# Patient Record
Sex: Female | Born: 1960 | Race: White | Hispanic: No | Marital: Married | Smoking: Light tobacco smoker
Health system: Southern US, Community
[De-identification: ages and names within clinical notes are randomized; demographics above are authoritative.]

## PROBLEM LIST (undated history)

## (undated) HISTORY — PX: ABDOMINAL HYSTERECTOMY: SHX81

---

## 2019-07-07 ENCOUNTER — Emergency Department (HOSPITAL_BASED_OUTPATIENT_CLINIC_OR_DEPARTMENT_OTHER): Payer: Worker's Compensation

## 2019-07-07 ENCOUNTER — Other Ambulatory Visit: Payer: Self-pay

## 2019-07-07 ENCOUNTER — Encounter (HOSPITAL_BASED_OUTPATIENT_CLINIC_OR_DEPARTMENT_OTHER): Payer: Self-pay | Admitting: Emergency Medicine

## 2019-07-07 ENCOUNTER — Emergency Department (HOSPITAL_BASED_OUTPATIENT_CLINIC_OR_DEPARTMENT_OTHER)
Admission: EM | Admit: 2019-07-07 | Discharge: 2019-07-07 | Disposition: A | Discharge status: Home / Self Care | Payer: Worker's Compensation | Attending: Emergency Medicine | Admitting: Emergency Medicine

## 2019-07-07 DIAGNOSIS — F17200 Nicotine dependence, unspecified, uncomplicated: Secondary | ICD-10-CM | POA: Insufficient documentation

## 2019-07-07 DIAGNOSIS — Y939 Activity, unspecified: Secondary | ICD-10-CM | POA: Insufficient documentation

## 2019-07-07 DIAGNOSIS — S0990XA Unspecified injury of head, initial encounter: Secondary | ICD-10-CM

## 2019-07-07 DIAGNOSIS — Y929 Unspecified place or not applicable: Secondary | ICD-10-CM | POA: Insufficient documentation

## 2019-07-07 DIAGNOSIS — W000XXA Fall on same level due to ice and snow, initial encounter: Secondary | ICD-10-CM | POA: Diagnosis not present

## 2019-07-07 DIAGNOSIS — W19XXXA Unspecified fall, initial encounter: Secondary | ICD-10-CM

## 2019-07-07 DIAGNOSIS — Y999 Unspecified external cause status: Secondary | ICD-10-CM | POA: Diagnosis not present

## 2019-07-07 DIAGNOSIS — Z79899 Other long term (current) drug therapy: Secondary | ICD-10-CM | POA: Insufficient documentation

## 2019-07-07 DIAGNOSIS — S0090XA Unspecified superficial injury of unspecified part of head, initial encounter: Secondary | ICD-10-CM

## 2019-07-07 DIAGNOSIS — S0003XA Contusion of scalp, initial encounter: Secondary | ICD-10-CM | POA: Diagnosis not present

## 2019-07-07 MED ORDER — ACETAMINOPHEN 500 MG PO TABS
1000.0000 mg | ORAL_TABLET | Freq: Once | ORAL | Status: AC
  Administered 2019-07-07: 1000 mg via ORAL
  Filled 2019-07-07: qty 2

## 2019-07-07 NOTE — ED Provider Notes (Signed)
[facility]ERG[facility] DEPARTMENT Provider Note  CSN: [id number] Arrival date & time: 07/07/19 0002  Chief Complaint(s) Fall  HPI Dorothy Branch is a 58 y.o. female presents to the emergency department after slipping on black ice 2 hours prior to arrival resulting in occipital head trauma.  Patient denies any loss of consciousness.  She does endorse headache and scalp pain.  No visual disturbance, neck pain, back pain, extremity pain, chest pain, shortness of breath.  Patient able to ambulate without complication.  No focal deficits.  Denies any other physical complaints.  Patient not on any anticoagulation.  HPI  Past Medical History History reviewed. No pertinent past medical history. There are no problems to display for this patient.  Home Medication(s) Prior to Admission medications   Medication Sig Start Date End Date Taking? Authorizing Provider  atenolol (TENORMIN) 25 MG tablet Take by mouth. 11/19/18  Yes [provider]  ergocalciferol (VITAMIN D2) 1.25 MG (50000 UT) capsule TAKE ONE CAPSULE BY MOUTH ONE TIME PER WEEK 05/27/19  Yes [provider]  FLUoxetine (PROZAC) 20 MG capsule TAKE 1 BY MOUTH DAILY 01/25/19  Yes [provider]  omeprazole (PRILOSEC) 20 MG capsule Take by mouth. 03/04/19 03/03/20 Yes [provider]  primidone (MYSOLINE) 50 MG tablet TAKE 1/2-1 TABLET MIDDAY AS NEEDED AND 1 TAB AT BEDTIME 06/29/19  Yes [provider]  tiZANidine (ZANAFLEX) 2 MG tablet TAKE 1-2 TABS UP TO TWICE DAILY AS NEEDED. 06/07/19  Yes [provider]  cetirizine (ZYRTEC) 10 MG tablet Take by mouth.    [provider]  ibuprofen (ADVIL) 800 MG tablet Take by mouth.    [provider]  Multiple Vitamin (MULTI-VITAMIN) tablet Take by mouth.    [provider]                                                                                                                                    Past  Surgical History Past Surgical History:  Procedure Laterality Date  . ABDOMINAL HYSTERECTOMY     Family History History reviewed. No pertinent family history.  Social History Social History   Tobacco Use  . Smoking status: Light Tobacco Smoker  . Smokeless tobacco: Never Used  Substance Use Topics  . Alcohol use: Not on file  . Drug use: Not on file   Allergies Prednisone and Sulfamethoxazole  Review of Systems Review of Systems All other systems are reviewed and are negative for acute change except as noted in the HPI  Physical Exam Vital Signs  I have reviewed the triage vital signs BP (!) 134/103 (BP Location: Left Arm)   Pulse 85   Temp 98.3 F (36.8 C) (Oral)   Resp 18   Ht 5\' 3"  (1.6 m)   Wt 53.1 kg   SpO2 95%   BMI 20.73 kg/m   Physical Exam Constitutional:      General: She is not  in acute distress.    Appearance: She is well-developed. She is not diaphoretic.  HENT:     Head: Normocephalic. Contusion present.      Right Ear: External ear normal.     Left Ear: External ear normal.     Nose: Nose normal.  Eyes:     General: No scleral icterus.       Right eye: No discharge.        Left eye: No discharge.     Conjunctiva/sclera: Conjunctivae normal.     Pupils: Pupils are equal, round, and reactive to light.  Cardiovascular:     Rate and Rhythm: Normal rate and regular rhythm.     Pulses:          Radial pulses are 2+ on the right side and 2+ on the left side.       Dorsalis pedis pulses are 2+ on the right side and 2+ on the left side.     Heart sounds: Normal heart sounds. No murmur. No friction rub. No gallop.   Pulmonary:     Effort: Pulmonary effort is normal. No respiratory distress.     Breath sounds: Normal breath sounds. No stridor. No wheezing.  Abdominal:     General: There is no distension.     Palpations: Abdomen is soft.     Tenderness: There is no abdominal tenderness.  Musculoskeletal:        General: No tenderness.      Cervical back: Normal range of motion and neck supple. No bony tenderness.     Thoracic back: No bony tenderness.     Lumbar back: No bony tenderness.     Comments: Clavicles stable. Chest stable to AP/Lat compression. Pelvis stable to Lat compression. No obvious extremity deformity. No chest or abdominal wall contusion.  Skin:    General: Skin is warm and dry.     Findings: No erythema or rash.  Neurological:     Mental Status: She is alert and oriented to person, place, and time.     Comments: Moving all extremities     ED Results and Treatments Labs (all labs ordered are listed, but only abnormal results are displayed) Labs Reviewed - No data to display                                                                                                                       EKG  EKG Interpretation  Date/Time:    Ventricular Rate:    PR Interval:    QRS Duration:   QT Interval:    QTC Calculation:   R Axis:     Text Interpretation:        Radiology CT Head Wo Contrast  Result Date: 07/07/2019 CLINICAL DATA:  Head trauma, intracranial venous injury suspected Patient reports she slipped on ice in the parking lot at her work striking head on concrete. Swelling. EXAM: CT HEAD WITHOUT CONTRAST TECHNIQUE: Contiguous axial images were obtained from the base of  the skull through the vertex without intravenous contrast. COMPARISON:  Report from head CT 05/17/2014, images not available. Report from brain MRI 12/14/2011. FINDINGS: Brain: No evidence of acute infarction, hemorrhage, hydrocephalus, extra-axial collection or mass lesion/mass effect. Encephalomalacia in the right parietal and to a lesser extent occipital lobes suggesting prior ischemia, however not described on 2015 head CT. Vascular: Atherosclerosis of skullbase vasculature without hyperdense vessel or abnormal calcification. Skull: No fracture or focal lesion. Sinuses/Orbits: Small fluid level in the left maxillary sinus with  scattered mucosal thickening of the ethmoid air cells. Mastoid air cells are clear. No evidence of acute facial fracture. Other: Right parietal scalp hematoma. IMPRESSION: 1. Right parietal scalp hematoma. No skull fracture or acute intracranial abnormality. 2. Mild encephalomalacia in the right parietal and to a lesser extent occipital lobes suggesting prior ischemia, however not described on 2015 head CT. Recommend correlation with clinical history. Electronically Signed   By: Narda Rutherford M.D.   On: 07/07/2019 01:43    Pertinent labs & imaging results that were available during my care of the patient were reviewed by me and considered in my medical decision making (see chart for details).  Medications Ordered in ED Medications  acetaminophen (TYLENOL) tablet 1,000 mg (1,000 mg Oral Given 07/07/19 0143)                                                                                                                                    Procedures Procedures  (including critical care time)  Medical Decision Making / ED Course I have reviewed the nursing notes for this encounter and the patient's prior records (if available in EHR or on provided paperwork).   Gretel Cantu was evaluated in Emergency Department on 07/07/2019 for the symptoms described in the history of present illness. She was evaluated in the context of the global COVID-19 pandemic, which necessitated consideration that the patient might be at risk for infection with the SARS-CoV-2 virus that causes COVID-19. Institutional protocols and algorithms that pertain to the evaluation of patients at risk for COVID-19 are in a state of rapid change based on information released by regulatory bodies including the CDC and federal and state organizations. These policies and algorithms were followed during the patient's care in the ED.  CT head without evidence of ICH.  No other evidence of trauma noted on exam requiring imaging or work-up.   Patient provided with Tylenol for pain.  The patient appears reasonably screened and/or stabilized for discharge and I doubt any other medical condition or other [facility] requiring further screening, evaluation, or treatment in the ED at this time prior to discharge.  The patient is safe for discharge with strict return precautions.       Final Clinical Impression(s) / ED Diagnoses Final diagnoses:  Fall, initial encounter  Minor head trauma  Hematoma of scalp, initial encounter     The patient appears reasonably screened and/or stabilized for  discharge and I doubt any other medical condition or other [facility] requiring further screening, evaluation, or treatment in the ED at this time prior to discharge.  Disposition: Discharge  Condition: Good  I have discussed the results, Dx and Tx plan with the patient who expressed understanding and agree(s) with the plan. Discharge instructions discussed at great length. The patient was given strict return precautions who verbalized understanding of the instructions. No further questions at time of discharge.    ED Discharge Orders    None       Follow Up: Primary care provider  Schedule an appointment as soon as possible for a visit  As needed     This chart was dictated using voice recognition software.  Despite best efforts to proofread,  errors can occur which can change the documentation meaning.   Nira Conn, MD 07/07/19 [phone]

## 2019-07-07 NOTE — ED Triage Notes (Signed)
Patient states she slipped on ice in the parking lot at her work and hit her head on the concrete; swelling noted to back of head; denies LOC; denies any other injuries. Ambulatory to triage without difficulty.

## 2020-03-17 IMAGING — CT CT HEAD W/O CM
3 series · 14 of 47 positions shown, 16 images · non-contrast
Comparison: Report from head CT 05/17/2014, images not available.
Report from brain MRI 12/14/2011.

CLINICAL DATA: Head trauma, intracranial venous injury suspected

Patient reports she slipped on ice in the parking lot at her work
striking head on concrete. Swelling.
EXAM:
CT HEAD WITHOUT CONTRAST
TECHNIQUE: Contiguous axial images were obtained from the base of the skull
through the vertex without intravenous contrast.

[Series 2: head (person_name) (person_name) · axial · 0.43mm/px · z∈[+828,+968]mm · 8 of 34 slices shown, 10 images]
[im 3/34  brain]
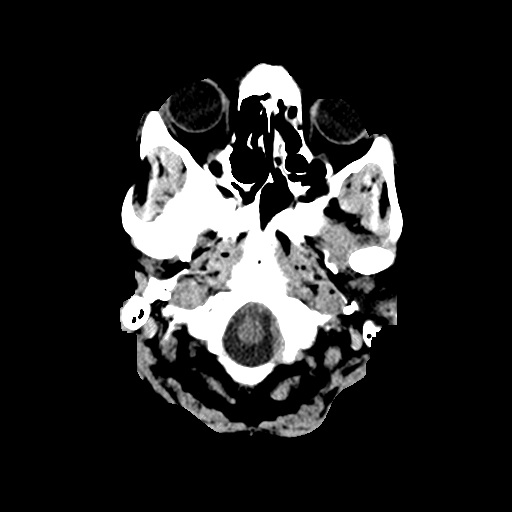
[im 3/34  bone]
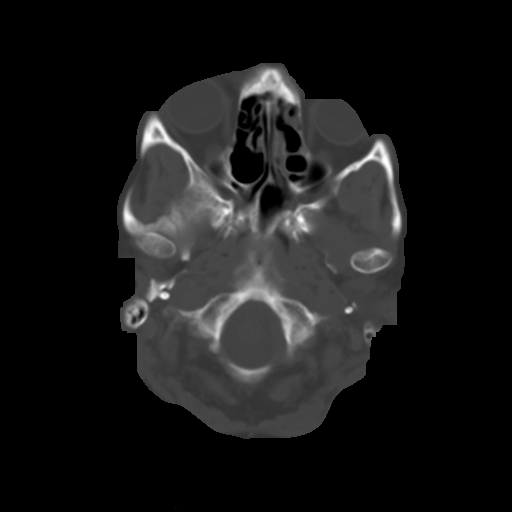
[im 7/34  brain]
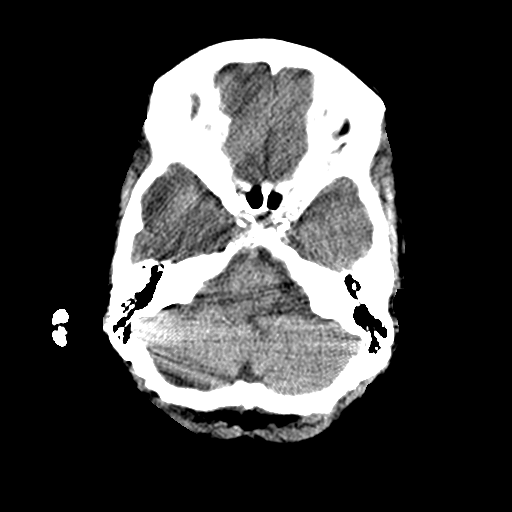
[im 11/34  brain]
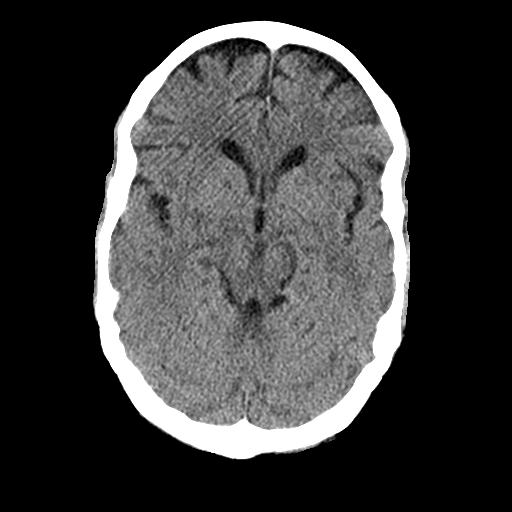
[im 15/34  brain]
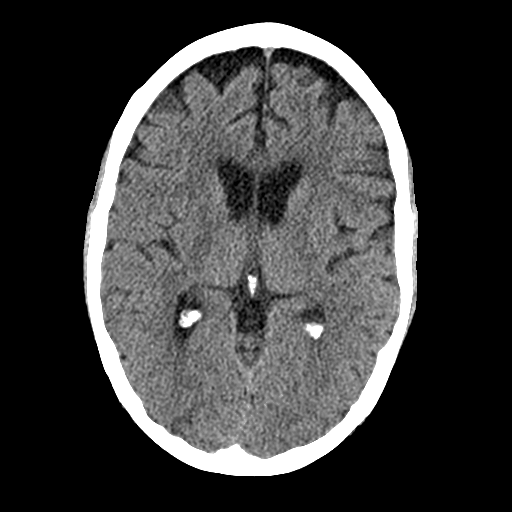
[im 19/34  brain]
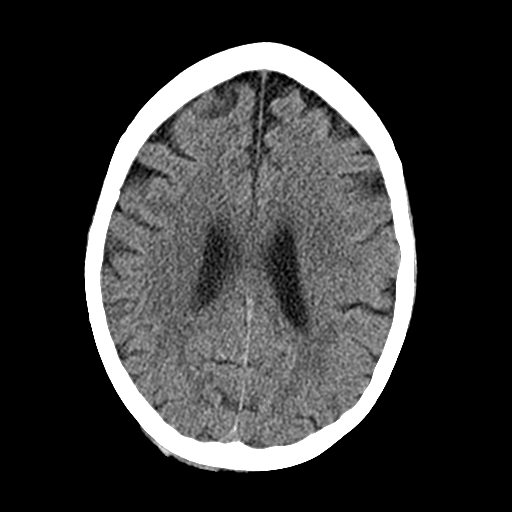
[im 19/34  bone]
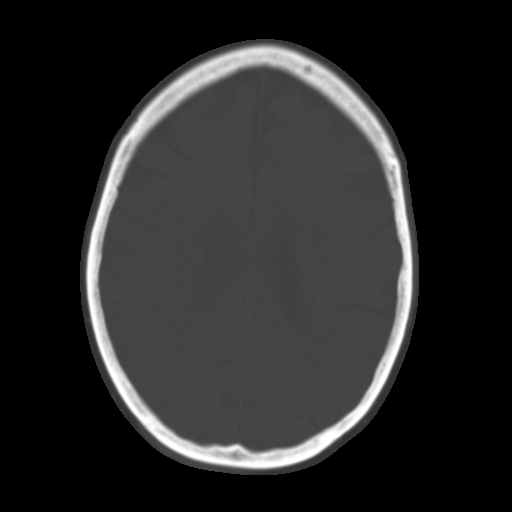
[im 23/34  brain]
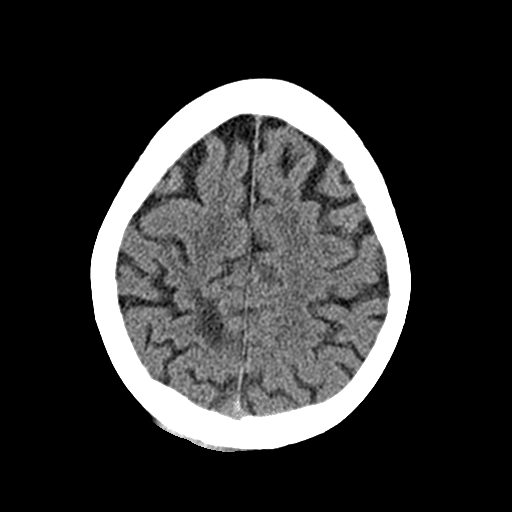
[im 27/34  brain]
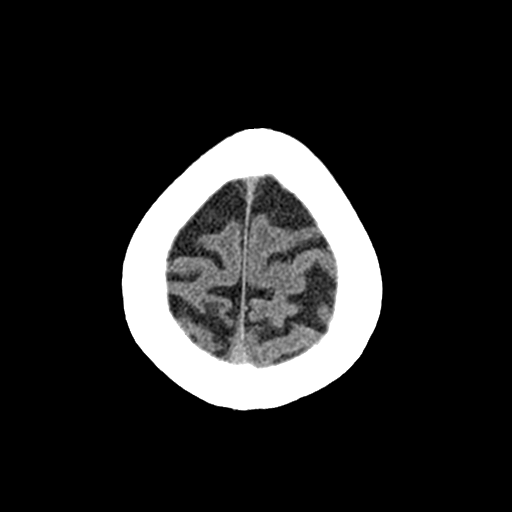
[im 31/34  brain]
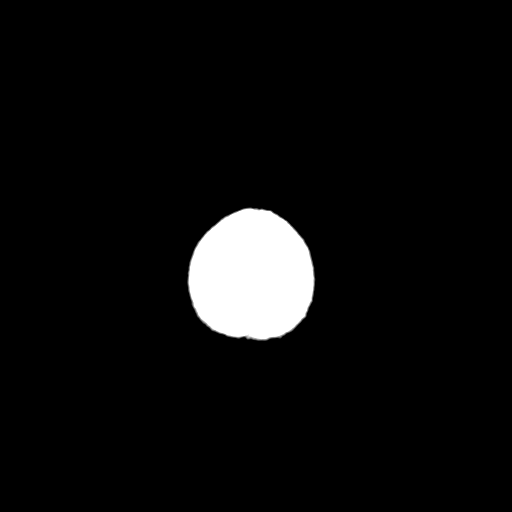

[Series 4: cor soft · coronal · 0.32mm/px · 3 of 76 slices shown]
[im 26/76  brain]
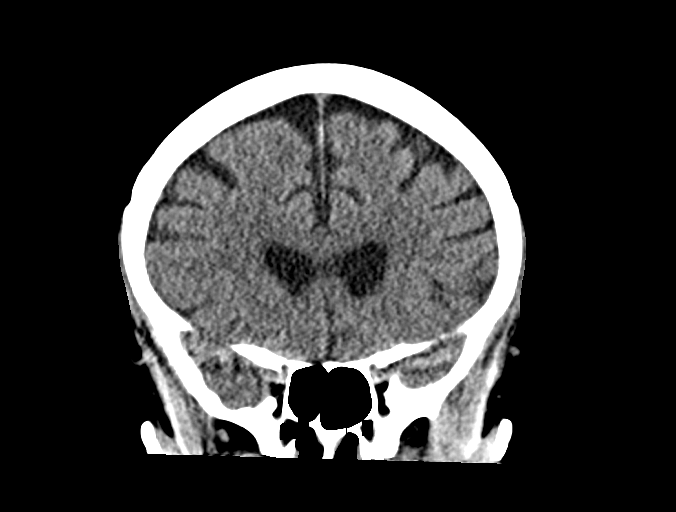
[im 34/76  brain]
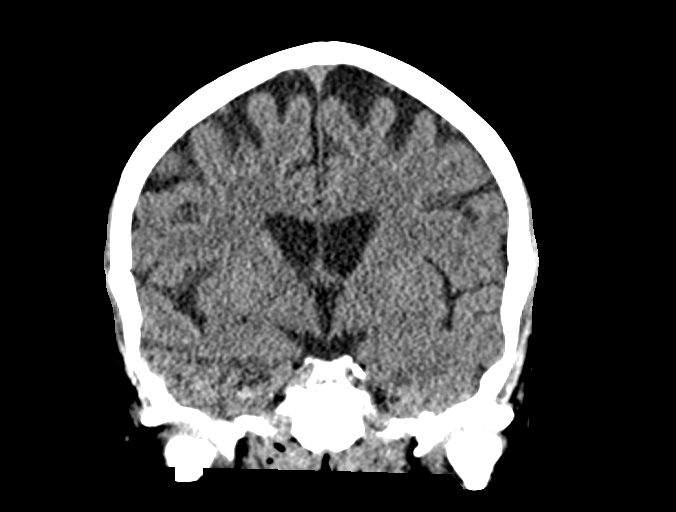
[im 42/76  brain]
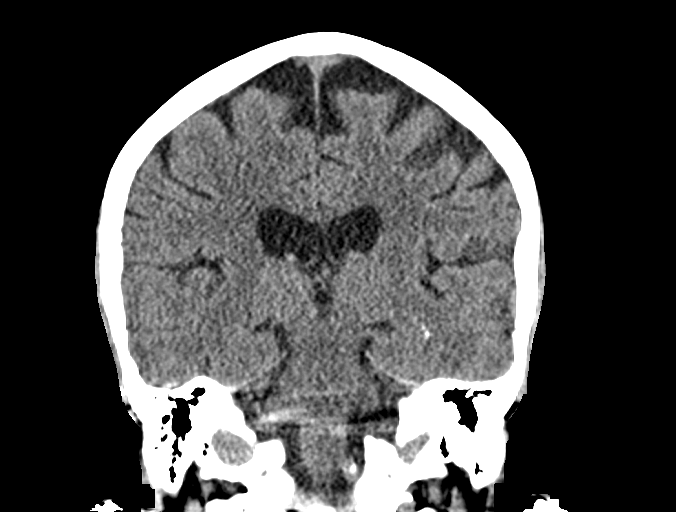

[Series 5: sag soft · sagittal · 0.37mm/px · 3 of 57 slices shown]
[im 19/57  brain]
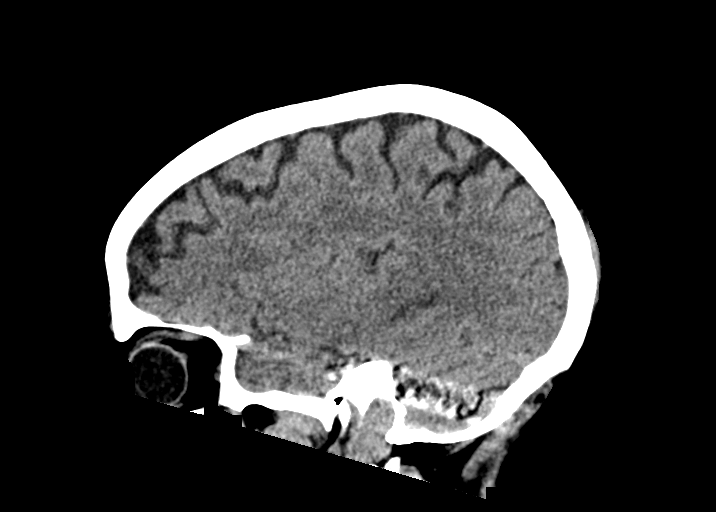
[im 29/57  brain]
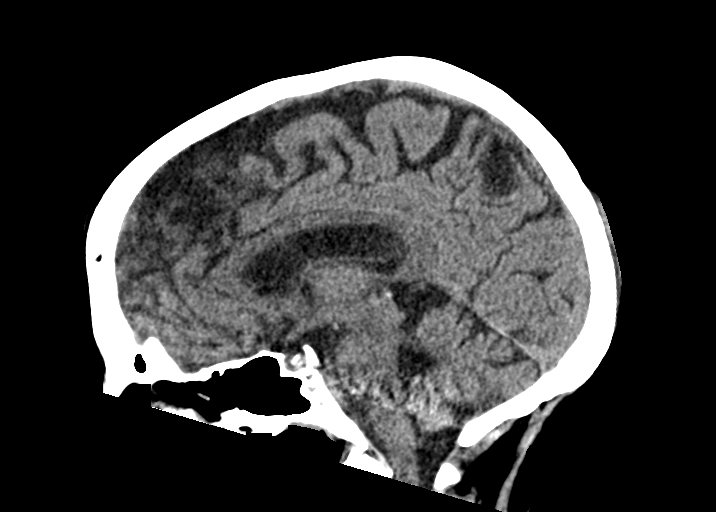
[im 38/57  brain]
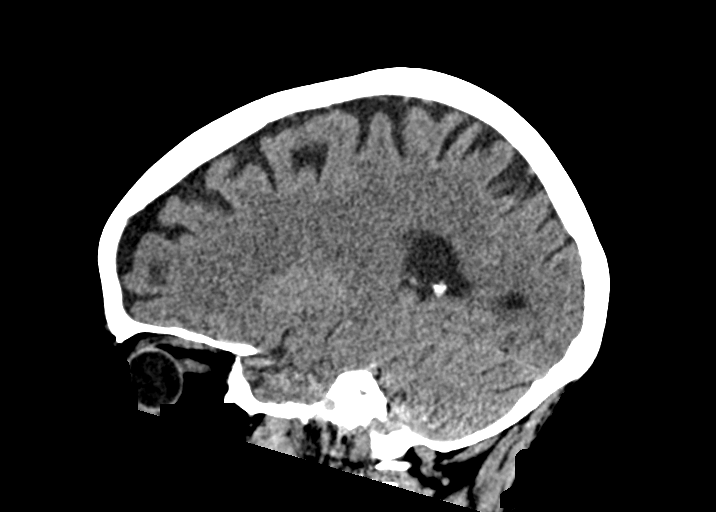

[14 of 47 positions shown; findings below may reference images not displayed]

FINDINGS: Brain: No evidence of acute infarction, hemorrhage, hydrocephalus,
extra-axial collection or mass lesion/mass effect. Encephalomalacia
in the right parietal and to a lesser extent occipital lobes
suggesting prior ischemia, however not described on 9241 head CT.

Vascular: Atherosclerosis of skullbase vasculature without
hyperdense vessel or abnormal calcification.

Skull: No fracture or focal lesion.

Sinuses/Orbits: Small fluid level in the left maxillary sinus with
scattered mucosal thickening of the ethmoid air cells. Mastoid air
cells are clear. No evidence of acute facial fracture.

Other: Right parietal scalp hematoma.
IMPRESSION: 1. Right parietal scalp hematoma. No skull fracture or acute
intracranial abnormality.
2. Mild encephalomalacia in the right parietal and to a lesser
extent occipital lobes suggesting prior ischemia, however not
described on 9241 head CT. Recommend correlation with clinical
history.

## 2022-07-04 ENCOUNTER — Encounter (HOSPITAL_COMMUNITY): Payer: Self-pay

## 2022-07-04 ENCOUNTER — Encounter (HOSPITAL_BASED_OUTPATIENT_CLINIC_OR_DEPARTMENT_OTHER): Payer: Self-pay

## 2022-07-04 ENCOUNTER — Other Ambulatory Visit: Payer: Self-pay

## 2022-07-04 ENCOUNTER — Emergency Department (HOSPITAL_BASED_OUTPATIENT_CLINIC_OR_DEPARTMENT_OTHER): Payer: PRIVATE HEALTH INSURANCE

## 2022-07-04 ENCOUNTER — Inpatient Hospital Stay (HOSPITAL_BASED_OUTPATIENT_CLINIC_OR_DEPARTMENT_OTHER)
Admission: EM | Admit: 2022-07-04 | Discharge: 2022-07-07 | DRG: 193 | Disposition: A | Discharge status: Home / Self Care | Payer: PRIVATE HEALTH INSURANCE | Attending: Internal Medicine | Admitting: Internal Medicine

## 2022-07-04 DIAGNOSIS — Z888 Allergy status to other drugs, medicaments and biological substances status: Secondary | ICD-10-CM | POA: Diagnosis not present

## 2022-07-04 DIAGNOSIS — J1001 Influenza due to other identified influenza virus with the same other identified influenza virus pneumonia: Principal | ICD-10-CM | POA: Diagnosis present

## 2022-07-04 DIAGNOSIS — E871 Hypo-osmolality and hyponatremia: Secondary | ICD-10-CM | POA: Diagnosis present

## 2022-07-04 DIAGNOSIS — J9601 Acute respiratory failure with hypoxia: Secondary | ICD-10-CM | POA: Diagnosis present

## 2022-07-04 DIAGNOSIS — J45909 Unspecified asthma, uncomplicated: Secondary | ICD-10-CM | POA: Diagnosis present

## 2022-07-04 DIAGNOSIS — F32A Depression, unspecified: Secondary | ICD-10-CM | POA: Diagnosis present

## 2022-07-04 DIAGNOSIS — F419 Anxiety disorder, unspecified: Secondary | ICD-10-CM | POA: Diagnosis present

## 2022-07-04 DIAGNOSIS — I10 Essential (primary) hypertension: Secondary | ICD-10-CM | POA: Diagnosis present

## 2022-07-04 DIAGNOSIS — Z79899 Other long term (current) drug therapy: Secondary | ICD-10-CM

## 2022-07-04 DIAGNOSIS — Z1152 Encounter for screening for COVID-19: Secondary | ICD-10-CM

## 2022-07-04 DIAGNOSIS — Z882 Allergy status to sulfonamides status: Secondary | ICD-10-CM | POA: Diagnosis not present

## 2022-07-04 DIAGNOSIS — E876 Hypokalemia: Secondary | ICD-10-CM | POA: Diagnosis present

## 2022-07-04 DIAGNOSIS — F17200 Nicotine dependence, unspecified, uncomplicated: Secondary | ICD-10-CM | POA: Diagnosis present

## 2022-07-04 DIAGNOSIS — J111 Influenza due to unidentified influenza virus with other respiratory manifestations: Principal | ICD-10-CM

## 2022-07-04 DIAGNOSIS — E861 Hypovolemia: Secondary | ICD-10-CM | POA: Diagnosis present

## 2022-07-04 DIAGNOSIS — J189 Pneumonia, unspecified organism: Secondary | ICD-10-CM

## 2022-07-04 DIAGNOSIS — F418 Other specified anxiety disorders: Secondary | ICD-10-CM

## 2022-07-04 DIAGNOSIS — J09X1 Influenza due to identified novel influenza A virus with pneumonia: Secondary | ICD-10-CM

## 2022-07-04 LAB — BASIC METABOLIC PANEL
Anion gap: 16 — ABNORMAL HIGH (ref 5–15)
BUN: 15 mg/dL (ref 8–23)
CO2: 21 mmol/L — ABNORMAL LOW (ref 22–32)
Calcium: 8.5 mg/dL — ABNORMAL LOW (ref 8.9–10.3)
Chloride: 93 mmol/L — ABNORMAL LOW (ref 98–111)
Creatinine, Ser: 0.82 mg/dL (ref 0.44–1.00)
GFR, Estimated: >60 mL/min (ref >60)
Glucose, Bld: 106 mg/dL — ABNORMAL HIGH (ref 70–99)
Potassium: 2.8 mmol/L — ABNORMAL LOW (ref 3.5–5.1)
Sodium: 130 mmol/L — ABNORMAL LOW (ref 135–145)

## 2022-07-04 LAB — CBC
HCT: 36.2 % (ref 36.0–46.0)
Hemoglobin: 13 g/dL (ref 12.0–15.0)
MCH: 34.2 pg — ABNORMAL HIGH (ref 26.0–34.0)
MCHC: 35.9 g/dL (ref 30.0–36.0)
MCV: 95.3 fL (ref 80.0–100.0)
Platelets: 218 10*3/uL (ref 150–400)
RBC: 3.8 MIL/uL — ABNORMAL LOW (ref 3.87–5.11)
RDW: 12.1 % (ref 11.5–15.5)
WBC: 9.6 10*3/uL (ref 4.0–10.5)
nRBC: 0 % (ref 0.0–0.2)

## 2022-07-04 LAB — PROCALCITONIN: Procalcitonin: 13.03 ng/mL

## 2022-07-04 MED ORDER — HYDROCODONE-ACETAMINOPHEN 5-325 MG PO TABS: 1.0000 | ORAL_TABLET | ORAL | Status: DC | PRN

## 2022-07-04 MED ORDER — MAGNESIUM SULFATE IN D5W 1-5 GM/100ML-% IV SOLN
1.0000 g | Freq: Once | INTRAVENOUS | Status: AC
  Administered 2022-07-04: 1 g via INTRAVENOUS
  Filled 2022-07-04: qty 100

## 2022-07-04 MED ORDER — ENOXAPARIN SODIUM 40 MG/0.4ML IJ SOSY
40.0000 mg | PREFILLED_SYRINGE | Freq: Every day | INTRAMUSCULAR | Status: DC
  Administered 2022-07-04 – 2022-07-06 (×3): 40 mg via SUBCUTANEOUS
  Filled 2022-07-04 (×3): qty 0.4

## 2022-07-04 MED ORDER — PANTOPRAZOLE SODIUM 40 MG PO TBEC
40.0000 mg | DELAYED_RELEASE_TABLET | Freq: Every day | ORAL | Status: DC
  Administered 2022-07-05 – 2022-07-07 (×3): 40 mg via ORAL
  Filled 2022-07-04 (×3): qty 1

## 2022-07-04 MED ORDER — OSELTAMIVIR PHOSPHATE 75 MG PO CAPS
75.0000 mg | ORAL_CAPSULE | Freq: Two times a day (BID) | ORAL | Status: DC
  Administered 2022-07-04 – 2022-07-07 (×7): 75 mg via ORAL
  Filled 2022-07-04 (×7): qty 1

## 2022-07-04 MED ORDER — POTASSIUM CHLORIDE 10 MEQ/100ML IV SOLN
10.0000 meq | Freq: Once | INTRAVENOUS | Status: AC
  Administered 2022-07-04: 10 meq via INTRAVENOUS
  Filled 2022-07-04: qty 100

## 2022-07-04 MED ORDER — AZITHROMYCIN 250 MG PO TABS
500.0000 mg | ORAL_TABLET | Freq: Every day | ORAL | Status: DC
  Administered 2022-07-05 – 2022-07-07 (×3): 500 mg via ORAL
  Filled 2022-07-04 (×3): qty 2

## 2022-07-04 MED ORDER — SODIUM CHLORIDE 0.9 % IV BOLUS (SEPSIS)
500.0000 mL | Freq: Once | INTRAVENOUS | Status: AC
  Administered 2022-07-04: 500 mL via INTRAVENOUS

## 2022-07-04 MED ORDER — GUAIFENESIN 100 MG/5ML PO LIQD
5.0000 mL | ORAL | Status: DC | PRN
  Administered 2022-07-06 – 2022-07-07 (×3): 5 mL via ORAL
  Filled 2022-07-04 (×3): qty 10

## 2022-07-04 MED ORDER — SODIUM CHLORIDE 0.9 % IV SOLN
2.0000 g | INTRAVENOUS | Status: DC
  Administered 2022-07-05 – 2022-07-06 (×2): 2 g via INTRAVENOUS
  Filled 2022-07-04 (×2): qty 20

## 2022-07-04 MED ORDER — ALBUTEROL SULFATE (2.5 MG/3ML) 0.083% IN NEBU: 2.5000 mg | INHALATION_SOLUTION | RESPIRATORY_TRACT | Status: DC | PRN

## 2022-07-04 MED ORDER — POLYETHYLENE GLYCOL 3350 17 G PO PACK: 17.0000 g | PACK | Freq: Every day | ORAL | Status: DC | PRN

## 2022-07-04 MED ORDER — TIZANIDINE HCL 4 MG PO TABS: 2.0000 mg | ORAL_TABLET | Freq: Two times a day (BID) | ORAL | Status: DC | PRN

## 2022-07-04 MED ORDER — ACETAMINOPHEN 650 MG RE SUPP: 650.0000 mg | Freq: Four times a day (QID) | RECTAL | Status: DC | PRN

## 2022-07-04 MED ORDER — ALBUTEROL SULFATE HFA 108 (90 BASE) MCG/ACT IN AERS: 2.0000 | INHALATION_SPRAY | RESPIRATORY_TRACT | Status: DC | PRN

## 2022-07-04 MED ORDER — IPRATROPIUM-ALBUTEROL 0.5-2.5 (3) MG/3ML IN SOLN
3.0000 mL | Freq: Once | RESPIRATORY_TRACT | Status: AC
  Administered 2022-07-04: 3 mL via RESPIRATORY_TRACT
  Filled 2022-07-04: qty 3

## 2022-07-04 MED ORDER — POTASSIUM CHLORIDE CRYS ER 20 MEQ PO TBCR
40.0000 meq | EXTENDED_RELEASE_TABLET | Freq: Once | ORAL | Status: AC
  Administered 2022-07-04: 40 meq via ORAL
  Filled 2022-07-04: qty 2

## 2022-07-04 MED ORDER — LACTATED RINGERS IV SOLN: INTRAVENOUS | Status: AC

## 2022-07-04 MED ORDER — SODIUM CHLORIDE 0.9 % IV SOLN
1.0000 g | INTRAVENOUS | Status: AC
  Administered 2022-07-04: 1 g via INTRAVENOUS
  Filled 2022-07-04: qty 10

## 2022-07-04 MED ORDER — ATENOLOL 25 MG PO TABS
25.0000 mg | ORAL_TABLET | Freq: Every day | ORAL | Status: DC
  Administered 2022-07-05 – 2022-07-07 (×3): 25 mg via ORAL
  Filled 2022-07-04 (×3): qty 1

## 2022-07-04 MED ORDER — FLUOXETINE HCL 20 MG PO CAPS
20.0000 mg | ORAL_CAPSULE | Freq: Every day | ORAL | Status: DC
  Administered 2022-07-05: 20 mg via ORAL
  Filled 2022-07-04: qty 1

## 2022-07-04 MED ORDER — SODIUM CHLORIDE 0.9 % IV SOLN
1000.0000 mL | INTRAVENOUS | Status: DC
  Administered 2022-07-04 (×2): 1000 mL via INTRAVENOUS

## 2022-07-04 MED ORDER — SODIUM CHLORIDE 0.9 % IV SOLN
1.0000 g | INTRAVENOUS | Status: DC
  Administered 2022-07-04: 1 g via INTRAVENOUS
  Filled 2022-07-04: qty 10

## 2022-07-04 MED ORDER — ACETAMINOPHEN 325 MG PO TABS: 650.0000 mg | ORAL_TABLET | Freq: Four times a day (QID) | ORAL | Status: DC | PRN

## 2022-07-04 MED ORDER — AZITHROMYCIN 250 MG PO TABS
500.0000 mg | ORAL_TABLET | Freq: Once | ORAL | Status: AC
  Administered 2022-07-04: 500 mg via ORAL
  Filled 2022-07-04: qty 2

## 2022-07-04 NOTE — ED Triage Notes (Signed)
Pt from UC for eval of low O2, flu+, symptom onset Christmas  Neb tx at [facility], advised to come to ED for further tx.   Pt 81% on RA; 4L to 93%

## 2022-07-04 NOTE — ED Notes (Signed)
Light green lab tube recollected and submitted to lab, per their request.

## 2022-07-04 NOTE — ED Notes (Signed)
Pt transported to xray 

## 2022-07-04 NOTE — H&P (Signed)
History and Physical    Jaylen Claude KGM:[id number] DOB: May 01, 1961 DOA: 07/04/2022  PCP: Heron Nay, PA   Patient coming from: Home   Chief Complaint: Cough, congestion, low O2 sat, flu A positive   HPI: Dorothy Branch is a pleasant 61 y.o. female with medical history significant for hypertension, depression, anxiety, and former smoker who presents with 4 days of cough and congestion and was hypoxic and positive for influenza A at urgent care today.  Patient reports that she developed aches, chills, and nonproductive cough on 06/30/2022.  Symptoms have progressively worsened since then and she has been short of breath.  She has had 2 loose stools but no vomiting.  [facility] ED Course: Upon arrival to the ED, patient is found to be afebrile, saturating mid 90s on 5 L/min of supplemental oxygen, and tachypneic with stable blood pressure.  Chest x-ray is concerning for pneumonia on the right.  Blood work notable for sodium 130 and potassium 2.8.  Patient was given 500 mL of normal saline, Rocephin, azithromycin, IV and oral potassium, Tamiflu, and DuoNeb in the ED.  She was transferred to [facility] for admission.  Review of Systems:  All other systems reviewed and apart from HPI, are negative.  History reviewed. No pertinent past medical history.  Past Surgical History:  Procedure Laterality Date   ABDOMINAL HYSTERECTOMY      Social History:   reports that she has been smoking. She has never used smokeless tobacco. No history on file for alcohol use and drug use.  Allergies  Allergen Reactions   Prednisone Rash   Sulfamethoxazole Rash    History reviewed. No pertinent family history.   Prior to Admission medications   Medication Sig Start Date End Date Taking? Authorizing Provider  atenolol (TENORMIN) 25 MG tablet Take by mouth. 11/19/18   [provider]  cetirizine (ZYRTEC) 10 MG tablet Take by mouth.    [provider]  ergocalciferol (VITAMIN  D2) 1.25 MG (50000 UT) capsule TAKE ONE CAPSULE BY MOUTH ONE TIME PER WEEK 05/27/19   [provider]  FLUoxetine (PROZAC) 20 MG capsule TAKE 1 BY MOUTH DAILY 01/25/19   [provider]  ibuprofen (ADVIL) 800 MG tablet Take by mouth.    [provider]  Multiple Vitamin (MULTI-VITAMIN) tablet Take by mouth.    [provider]  omeprazole (PRILOSEC) 20 MG capsule Take by mouth. 03/04/19 03/03/20  [provider]  primidone (MYSOLINE) 50 MG tablet TAKE 1/2-1 TABLET MIDDAY AS NEEDED AND 1 TAB AT BEDTIME 06/29/19   [provider]  tiZANidine (ZANAFLEX) 2 MG tablet TAKE 1-2 TABS UP TO TWICE DAILY AS NEEDED. 06/07/19   [provider]    Physical Exam: Vitals:   07/04/22 1700 07/04/22 1730 07/04/22 1850 07/04/22 2002  BP: (!) 141/76 (!) 160/88 (!) 163/84   Pulse: 92 95 (!) 106   Resp:   18   Temp:   98.2 F (36.8 C)   TempSrc:   Oral   SpO2: 94% 94% 94%   Weight:    56.1 kg  Height:    5\' 2"  (1.575 m)    Constitutional: NAD, calm  Eyes: PERTLA, lids and conjunctivae normal ENMT: Mucous membranes are moist. Posterior pharynx clear of any exudate or lesions.   Neck: supple, no masses  Respiratory: rales on right, no wheezing. No accessory muscle use.  Cardiovascular: S1 & S2 heard, regular rate and rhythm. No extremity edema.   Abdomen: No distension, no  tenderness, soft. Bowel sounds active.  Musculoskeletal: no clubbing / cyanosis. No joint deformity upper and lower extremities.   Skin: no significant rashes, lesions, ulcers. Warm, dry, well-perfused. Neurologic: CN 2-12 grossly intact. Moving all extremities. Alert and oriented.  Psychiatric: Pleasant. Cooperative.    Labs and Imaging on Admission: I have personally reviewed following labs and imaging studies  CBC: Recent Labs  Lab 07/04/22 1428  WBC 9.6  HGB 13.0  HCT 36.2  MCV 95.3  PLT 218   Basic Metabolic Panel: Recent Labs  Lab 07/04/22 1428  NA 130*  K  2.8*  CL 93*  CO2 21*  GLUCOSE 106*  BUN 15  CREATININE 0.82  CALCIUM 8.5*   GFR: Estimated Creatinine Clearance: 55.5 mL/min (by C-G formula based on SCr of 0.82 mg/dL). Liver Function Tests: No results for input(s): "AST", "ALT", "ALKPHOS", "BILITOT", "PROT", "ALBUMIN" in the last 168 hours. No results for input(s): "LIPASE", "AMYLASE" in the last 168 hours. No results for input(s): "AMMONIA" in the last 168 hours. Coagulation Profile: No results for input(s): "INR", "PROTIME" in the last 168 hours. Cardiac Enzymes: No results for input(s): "CKTOTAL", "CKMB", "CKMBINDEX", "TROPONINI" in the last 168 hours. BNP (last 3 results) No results for input(s): "PROBNP" in the last 8760 hours. HbA1C: No results for input(s): "HGBA1C" in the last 72 hours. CBG: No results for input(s): "GLUCAP" in the last 168 hours. Lipid Profile: No results for input(s): "CHOL", "HDL", "LDLCALC", "TRIG", "CHOLHDL", "LDLDIRECT" in the last 72 hours. Thyroid Function Tests: No results for input(s): "TSH", "T4TOTAL", "FREET4", "T3FREE", "THYROIDAB" in the last 72 hours. Anemia Panel: No results for input(s): "VITAMINB12", "FOLATE", "FERRITIN", "TIBC", "IRON", "RETICCTPCT" in the last 72 hours. Urine analysis: No results found for: "COLORURINE", "APPEARANCEUR", "LABSPEC", "PHURINE", "GLUCOSEU", "HGBUR", "BILIRUBINUR", "KETONESUR", "PROTEINUR", "UROBILINOGEN", "NITRITE", "LEUKOCYTESUR" Sepsis Labs: @LABRCNTIP (procalcitonin:4,lacticidven:4) )No results found for this or any previous visit (from the past 240 hour(s)).   Radiological Exams on Admission: DG Chest 2 View  Result Date: 07/04/2022 CLINICAL DATA:  Hypoxia.  Influenza.  Body aches. EXAM: CHEST - 2 VIEW COMPARISON:  Chest two views 04/23/2021, 03/30/2021 FINDINGS: Cardiac silhouette and mediastinal contours are within normal limits. There are again increased lucencies within upper lungs and moderate hyperinflation, chronic emphysematous changes.  New interstitial thickening and heterogeneous airspace opacification within the right mid to upper lung, likely the anterior right upper lobe. More mild heterogeneous airspace opacification within the right lower lung. No pleural effusion pneumothorax. Mild-to-moderate multilevel degenerative disc changes of the thoracic spine. IMPRESSION: 1. Interstitial thickening and heterogeneous airspace opacification within the right mid to upper lung, likely pneumonia within the anterior right upper lobe. Recommend radiographic follow-up in 4-6 weeks to ensure complete resolution. 2. Chronic emphysematous changes. Electronically Signed   By: 04/01/2021 M.D.   On: 07/04/2022 15:06     Assessment/Plan   1. Influenza A; pneumonia; acute hypoxic respiratory failure  - Patient has influenza A with pneumonia findings on CXR and new 5 Lpm supplemental O2 requirement in ED  - She was started on Tamiflu, Rocephin, and azithromycin in ED  - Check/trend procalcitonin, check strep pneumo and legionella antigens, continue Tamiflu, continue antibiotics pending procalcitonin level, continue supplemental O2 as needed, supportive care, droplet precautions   2. Hypokalemia  - Serum potassium is 2.8 in ED  - Replacing    3. Hyponatremia  - Serum sodium is 130 on admission in setting of hypovolemia  - Continue isotonic IVF hydration and repeat chem panel in am  4. Hypertension  - Continue atenolol   5. Depression, anxiety  - Continue Prozac    DVT prophylaxis: Lovenox  Code Status: Full  Level of Care: Level of care: Med-Surg Family Communication: none present  Disposition Plan:  Patient is from: home  Anticipated d/c is to: TBD Anticipated d/c date is: 07/08/22 Patient currently: Pending improved respiratory status  Consults called: none  Admission status: Inpatient     Briscoe Deutscher, MD [facility]  07/04/2022, 8:05 PM

## 2022-07-04 NOTE — ED Provider Notes (Signed)
[facility] EMERGENCY DEPARTMENT Provider Note   CSN: [id number] Arrival date & time: 07/04/22  1331     History  Chief Complaint  Patient presents with   Hypoxia   Influenza    Dorothy Branch is a 61 y.o. female.   Influenza    Patient has history of asthma, recurrent bronchitis and sinus infections..  She has prior history of smoking but is not an active smoker.  Patient's had URI type symptoms over the last several days.  She has had cough congestion.  She feels like she has mucus in her chest that she is not able to get up.  Patient went to an urgent care today to be evaluated.  She had a flu test and was positive for influenza A.  Patient's oxygen saturation was decreased in the 80s on evaluation.  Patient was given a DuoNeb treatment without improvement.  They recommend transfer to the emergency room for further treatment and evaluation.    Home Medications Prior to Admission medications   Medication Sig Start Date End Date Taking? Authorizing Provider  atenolol (TENORMIN) 25 MG tablet Take by mouth. 11/19/18   [provider]  cetirizine (ZYRTEC) 10 MG tablet Take by mouth.    [provider]  ergocalciferol (VITAMIN D2) 1.25 MG (50000 UT) capsule TAKE ONE CAPSULE BY MOUTH ONE TIME PER WEEK 05/27/19   [provider]  FLUoxetine (PROZAC) 20 MG capsule TAKE 1 BY MOUTH DAILY 01/25/19   [provider]  ibuprofen (ADVIL) 800 MG tablet Take by mouth.    [provider]  Multiple Vitamin (MULTI-VITAMIN) tablet Take by mouth.    [provider]  omeprazole (PRILOSEC) 20 MG capsule Take by mouth. 03/04/19 03/03/20  [provider]  primidone (MYSOLINE) 50 MG tablet TAKE 1/2-1 TABLET MIDDAY AS NEEDED AND 1 TAB AT BEDTIME 06/29/19   [provider]  tiZANidine (ZANAFLEX) 2 MG tablet TAKE 1-2 TABS UP TO TWICE DAILY AS NEEDED. 06/07/19   [provider]      Allergies    Prednisone and  Sulfamethoxazole    Review of Systems   Review of Systems  Physical Exam Updated Vital Signs BP (!) 163/84 (BP Location: Left Arm)   Pulse (!) 106   Temp 98.2 F (36.8 C) (Oral)   Resp 18   SpO2 94%  Physical Exam Vitals and nursing note reviewed.  Constitutional:      General: She is not in acute distress.    Appearance: She is well-developed.  HENT:     Head: Normocephalic and atraumatic.     Right Ear: External ear normal.     Left Ear: External ear normal.  Eyes:     General: No scleral icterus.       Right eye: No discharge.        Left eye: No discharge.     Conjunctiva/sclera: Conjunctivae normal.  Neck:     Trachea: No tracheal deviation.  Cardiovascular:     Rate and Rhythm: Normal rate and regular rhythm.  Pulmonary:     Effort: Pulmonary effort is normal. No respiratory distress.     Breath sounds: No stridor. Rhonchi present. No wheezing or rales.  Abdominal:     General: Bowel sounds are normal. There is no distension.     Palpations: Abdomen is soft.     Tenderness: There is no abdominal tenderness. There is no guarding or rebound.  Musculoskeletal:        General: No  tenderness or deformity.     Cervical back: Neck supple.     Right lower leg: No edema.     Left lower leg: No edema.  Skin:    General: Skin is warm and dry.     Findings: No rash.  Neurological:     General: No focal deficit present.     Mental Status: She is alert.     Cranial Nerves: No cranial nerve deficit, dysarthria or facial asymmetry.     Sensory: No sensory deficit.     Motor: No abnormal muscle tone or seizure activity.     Coordination: Coordination normal.  Psychiatric:        Mood and Affect: Mood normal.     ED Results / Procedures / Treatments   Labs (all labs ordered are listed, but only abnormal results are displayed) Labs Reviewed  CBC - Abnormal; Notable for the following components:      Result Value   RBC 3.80 (*)    MCH 34.2 (*)    All other  components within normal limits  BASIC METABOLIC PANEL - Abnormal; Notable for the following components:   Sodium 130 (*)    Potassium 2.8 (*)    Chloride 93 (*)    CO2 21 (*)    Glucose, Bld 106 (*)    Calcium 8.5 (*)    Anion gap 16 (*)    All other components within normal limits    EKG None  Radiology DG Chest 2 View  Result Date: 07/04/2022 CLINICAL DATA:  Hypoxia.  Influenza.  Body aches. EXAM: CHEST - 2 VIEW COMPARISON:  Chest two views 04/23/2021, 03/30/2021 FINDINGS: Cardiac silhouette and mediastinal contours are within normal limits. There are again increased lucencies within upper lungs and moderate hyperinflation, chronic emphysematous changes. New interstitial thickening and heterogeneous airspace opacification within the right mid to upper lung, likely the anterior right upper lobe. More mild heterogeneous airspace opacification within the right lower lung. No pleural effusion pneumothorax. Mild-to-moderate multilevel degenerative disc changes of the thoracic spine. IMPRESSION: 1. Interstitial thickening and heterogeneous airspace opacification within the right mid to upper lung, likely pneumonia within the anterior right upper lobe. Recommend radiographic follow-up in 4-6 weeks to ensure complete resolution. 2. Chronic emphysematous changes. Electronically Signed   By: Neita Garnet M.D.   On: 07/04/2022 15:06    Procedures Procedures    Medications Ordered in ED Medications  sodium chloride 0.9 % bolus 500 mL (0 mLs Intravenous Stopped 07/04/22 1450)    Followed by  0.9 %  sodium chloride infusion (1,000 mLs Intravenous New Bag/Given 07/04/22 1854)  cefTRIAXone (ROCEPHIN) 1 g in sodium chloride 0.9 % 100 mL IVPB (0 g Intravenous Stopped 07/04/22 1618)  oseltamivir (TAMIFLU) capsule 75 mg (75 mg Oral Given 07/04/22 1540)  cefTRIAXone (ROCEPHIN) 1 g in sodium chloride 0.9 % 100 mL IVPB (has no administration in time range)  cefTRIAXone (ROCEPHIN) 2 g in sodium chloride  0.9 % 100 mL IVPB (has no administration in time range)  ipratropium-albuterol (DUONEB) 0.5-2.5 (3) MG/3ML nebulizer solution 3 mL (3 mLs Nebulization Given 07/04/22 1429)  potassium chloride SA (KLOR-CON M) CR tablet 40 mEq (40 mEq Oral Given 07/04/22 1541)  potassium chloride 10 mEq in 100 mL IVPB (0 mEq Intravenous Stopped 07/04/22 1734)  azithromycin (ZITHROMAX) tablet 500 mg (500 mg Oral Given 07/04/22 1542)    ED Course/ Medical Decision Making/ A&P Clinical Course as of 07/04/22 1939  Thu Jul 04, 2022  1512 Chest  x-ray shows signs of pneumonia. [JK]  1512 Basic metabolic panel(!) Metabolic panel notable for hypokalemia [JK]  1512 CBC(!) Normal [JK]  1529 Case discussed with Dr. Radonna Ricker regarding admission [JK]    Clinical Course User Index [JK] Linwood Dibbles, MD                           Medical Decision Making Problems Addressed: Community acquired pneumonia, unspecified laterality: acute illness or injury that poses a threat to life or bodily functions Hypokalemia: acute illness or injury Influenza: acute illness or injury that poses a threat to life or bodily functions  Amount and/or Complexity of Data Reviewed Labs: ordered. Decision-making details documented in ED Course. Radiology: ordered and independent interpretation performed.  Risk Prescription drug management. Drug therapy requiring intensive monitoring for toxicity. Decision regarding hospitalization.   Pt presents with acute influenza, new oxygen requirement.  CXR shows pneumonia.   Pt started on IV abx, requiring supplemental o2.  Neb treatment without improvement.  Hypokalemia treated with IV and oral replacement.  Consulted with Dr Radonna Ricker regarding admission        Final Clinical Impression(s) / ED Diagnoses Final diagnoses:  Influenza  Community acquired pneumonia, unspecified laterality  Hypokalemia    Rx / DC Orders ED Discharge Orders     None         Linwood Dibbles, MD 07/04/22 1944

## 2022-07-04 NOTE — ED Notes (Signed)
[facility] at bedside 

## 2022-07-05 DIAGNOSIS — J111 Influenza due to unidentified influenza virus with other respiratory manifestations: Secondary | ICD-10-CM

## 2022-07-05 DIAGNOSIS — J189 Pneumonia, unspecified organism: Secondary | ICD-10-CM

## 2022-07-05 LAB — CBC
HCT: 32.7 % — ABNORMAL LOW (ref 36.0–46.0)
Hemoglobin: 11.3 g/dL — ABNORMAL LOW (ref 12.0–15.0)
MCH: 33.7 pg (ref 26.0–34.0)
MCHC: 34.6 g/dL (ref 30.0–36.0)
MCV: 97.6 fL (ref 80.0–100.0)
Platelets: 190 10*3/uL (ref 150–400)
RBC: 3.35 MIL/uL — ABNORMAL LOW (ref 3.87–5.11)
RDW: 12.4 % (ref 11.5–15.5)
WBC: 9.8 10*3/uL (ref 4.0–10.5)
nRBC: 0 % (ref 0.0–0.2)

## 2022-07-05 LAB — BASIC METABOLIC PANEL
Anion gap: 9 (ref 5–15)
BUN: 9 mg/dL (ref 8–23)
CO2: 24 mmol/L (ref 22–32)
Calcium: 8.5 mg/dL — ABNORMAL LOW (ref 8.9–10.3)
Chloride: 98 mmol/L (ref 98–111)
Creatinine, Ser: 0.58 mg/dL (ref 0.44–1.00)
GFR, Estimated: >60 mL/min (ref >60)
Glucose, Bld: 105 mg/dL — ABNORMAL HIGH (ref 70–99)
Potassium: 2.5 mmol/L — CL (ref 3.5–5.1)
Sodium: 131 mmol/L — ABNORMAL LOW (ref 135–145)

## 2022-07-05 LAB — HIV ANTIBODY (ROUTINE TESTING W REFLEX): HIV Screen 4th Generation wRfx: NONREACTIVE

## 2022-07-05 LAB — MAGNESIUM: Magnesium: 2.2 mg/dL (ref 1.7–2.4)

## 2022-07-05 LAB — PROCALCITONIN: Procalcitonin: 8.39 ng/mL

## 2022-07-05 LAB — STREP PNEUMONIAE URINARY ANTIGEN: Strep Pneumo Urinary Antigen: POSITIVE — AB

## 2022-07-05 MED ORDER — POTASSIUM CHLORIDE CRYS ER 20 MEQ PO TBCR
40.0000 meq | EXTENDED_RELEASE_TABLET | Freq: Two times a day (BID) | ORAL | Status: AC
  Administered 2022-07-05 (×2): 40 meq via ORAL
  Filled 2022-07-05 (×2): qty 2

## 2022-07-05 MED ORDER — SODIUM CHLORIDE 0.9 % IV SOLN: INTRAVENOUS | Status: AC

## 2022-07-05 MED ORDER — POTASSIUM CHLORIDE 10 MEQ/100ML IV SOLN
10.0000 meq | INTRAVENOUS | Status: AC
  Administered 2022-07-05 (×6): 10 meq via INTRAVENOUS
  Filled 2022-07-05 (×6): qty 100

## 2022-07-05 MED ORDER — FLUOXETINE HCL 20 MG PO CAPS
40.0000 mg | ORAL_CAPSULE | Freq: Every day | ORAL | Status: DC
  Administered 2022-07-06 – 2022-07-07 (×2): 40 mg via ORAL
  Filled 2022-07-05 (×2): qty 2

## 2022-07-05 MED ORDER — POTASSIUM CHLORIDE CRYS ER 20 MEQ PO TBCR
40.0000 meq | EXTENDED_RELEASE_TABLET | Freq: Once | ORAL | Status: AC
  Administered 2022-07-05: 40 meq via ORAL
  Filled 2022-07-05: qty 2

## 2022-07-05 NOTE — Progress Notes (Signed)
[facility] PROGRESS NOTE    Progress Note  Dorothy Branch  NKN:[id number] DOB: 03-31-1961 DOA: 07/04/2022 PCP: Heron Nay, PA     Brief Narrative:   Dorothy Branch is an 61 y.o. female past medical history significant for essential hypertension former smoker who presents with 4 days of cough hypoxic and positive for influenza A on the day of admission  Assessment/Plan:   Acute respiratory failure with hypoxia (HCC) due to Influenza A with pneumonia Chest x-ray showed infiltrates. Currently requiring 5 L of oxygen keep saturations greater 90%. Was started on Tamiflu Rocephin and azithromycin. Pro-Calcitonin significantly elevated, urine strep pneumo was positive agree with IV antibiotics. Out of bed to chair consult incentive spirometry.  Hypokalemia Replete orally recheck tomorrow morning. Mag is 2.2.  Hypovolemic hyponatremia Continue IV fluid hydration sodium is improving slowly.  Essential hypertension Continue atenolol.  Depression with anxiety Continue Prozac.  DVT prophylaxis: lovenox Family Communication:none Status is: Inpatient Remains inpatient appropriate because: Acute respiratory failure with hypoxia due to influenza pneumonia    Code Status:     Code Status Orders  (From admission, onward)           Start     Ordered   07/04/22 2005  Full code  Continuous       Question:  By:  Answer:  Consent: discussion documented in EHR   07/04/22 2005           Code Status History     This patient has a current code status but no historical code status.         IV Access:   Peripheral IV   Procedures and diagnostic studies:   DG Chest 2 View  Result Date: 07/04/2022 CLINICAL DATA:  Hypoxia.  Influenza.  Body aches. EXAM: CHEST - 2 VIEW COMPARISON:  Chest two views 04/23/2021, 03/30/2021 FINDINGS: Cardiac silhouette and mediastinal contours are within normal limits. There are again increased lucencies within upper lungs and  moderate hyperinflation, chronic emphysematous changes. New interstitial thickening and heterogeneous airspace opacification within the right mid to upper lung, likely the anterior right upper lobe. More mild heterogeneous airspace opacification within the right lower lung. No pleural effusion pneumothorax. Mild-to-moderate multilevel degenerative disc changes of the thoracic spine. IMPRESSION: 1. Interstitial thickening and heterogeneous airspace opacification within the right mid to upper lung, likely pneumonia within the anterior right upper lobe. Recommend radiographic follow-up in 4-6 weeks to ensure complete resolution. 2. Chronic emphysematous changes. Electronically Signed   By: Neita Garnet M.D.   On: 07/04/2022 15:06     Medical Consultants:   None.   Subjective:    Dorothy Branch relates her breathing is unchanged.  Objective:    Vitals:   07/04/22 2002 07/04/22 2008 07/05/22 0011 07/05/22 0414  BP:  (!) 147/71 138/73 131/70  Pulse:  99 94 85  Resp:  20 18 20   Temp:  99.3 F (37.4 C) 98.8 F (37.1 C) 99.1 F (37.3 C)  TempSrc:  Oral Oral Oral  SpO2:  99% 98% 97%  Weight: 56.1 kg     Height: 5\' 2"  (1.575 m)      SpO2: 97 % O2 Flow Rate (L/min): 5 L/min   Intake/Output Summary (Last 24 hours) at 07/05/2022 0903 Last data filed at 07/04/2022 1734 Gross per 24 hour  Intake 700 ml  Output --  Net 700 ml   Filed Weights   07/04/22 2002  Weight: 56.1 kg    Exam: General exam: In no acute  distress. Respiratory system: Good air movement and diffuse crackles bilaterally Cardiovascular system: S1 & S2 heard, RRR. No JVD. Gastrointestinal system: Abdomen is nondistended, soft and nontender.  Extremities: No pedal edema. Skin: No rashes, lesions or ulcers Psychiatry: Judgement and insight appear normal. Mood & affect appropriate.    Data Reviewed:    Labs: Basic Metabolic Panel: Recent Labs  Lab 07/04/22 1428 07/05/22 0506  NA 130* 131*  K 2.8* 2.5*  CL  93* 98  CO2 21* 24  GLUCOSE 106* 105*  BUN 15 9  CREATININE 0.82 0.58  CALCIUM 8.5* 8.5*  MG  --  2.2   GFR Estimated Creatinine Clearance: 56.9 mL/min (by C-G formula based on SCr of 0.58 mg/dL). Liver Function Tests: No results for input(s): "AST", "ALT", "ALKPHOS", "BILITOT", "PROT", "ALBUMIN" in the last 168 hours. No results for input(s): "LIPASE", "AMYLASE" in the last 168 hours. No results for input(s): "AMMONIA" in the last 168 hours. Coagulation profile No results for input(s): "INR", "PROTIME" in the last 168 hours. COVID-19 Labs  No results for input(s): "DDIMER", "FERRITIN", "LDH", "CRP" in the last 72 hours.  No results found for: "SARSCOV2NAA"  CBC: Recent Labs  Lab 07/04/22 1428 07/05/22 0506  WBC 9.6 9.8  HGB 13.0 11.3*  HCT 36.2 32.7*  MCV 95.3 97.6  PLT 218 190   Cardiac Enzymes: No results for input(s): "CKTOTAL", "CKMB", "CKMBINDEX", "TROPONINI" in the last 168 hours. BNP (last 3 results) No results for input(s): "PROBNP" in the last 8760 hours. CBG: No results for input(s): "GLUCAP" in the last 168 hours. D-Dimer: No results for input(s): "DDIMER" in the last 72 hours. Hgb A1c: No results for input(s): "HGBA1C" in the last 72 hours. Lipid Profile: No results for input(s): "CHOL", "HDL", "LDLCALC", "TRIG", "CHOLHDL", "LDLDIRECT" in the last 72 hours. Thyroid function studies: No results for input(s): "TSH", "T4TOTAL", "T3FREE", "THYROIDAB" in the last 72 hours.  Invalid input(s): "FREET3" Anemia work up: No results for input(s): "VITAMINB12", "FOLATE", "FERRITIN", "TIBC", "IRON", "RETICCTPCT" in the last 72 hours. Sepsis Labs: Recent Labs  Lab 07/04/22 1428 07/04/22 2120 07/05/22 0506  PROCALCITON  --  13.03 8.39  WBC 9.6  --  9.8   Microbiology No results found for this or any previous visit (from the past 240 hour(s)).   Medications:    atenolol  25 mg Oral Daily   azithromycin  500 mg Oral Daily   enoxaparin (LOVENOX)  injection  40 mg Subcutaneous QHS   FLUoxetine  20 mg Oral Daily   oseltamivir  75 mg Oral BID   pantoprazole  40 mg Oral Daily   Continuous Infusions:  cefTRIAXone (ROCEPHIN)  IV     potassium chloride 10 mEq (07/05/22 0827)      LOS: 1 day   Marinda Elk  [facility]  07/05/2022, 9:03 AM

## 2022-07-06 LAB — BASIC METABOLIC PANEL
Anion gap: 10 (ref 5–15)
BUN: 8 mg/dL (ref 8–23)
CO2: 20 mmol/L — ABNORMAL LOW (ref 22–32)
Calcium: 8.9 mg/dL (ref 8.9–10.3)
Chloride: 102 mmol/L (ref 98–111)
Creatinine, Ser: 0.64 mg/dL (ref 0.44–1.00)
GFR, Estimated: >60 mL/min (ref >60)
Glucose, Bld: 82 mg/dL (ref 70–99)
Potassium: 4 mmol/L (ref 3.5–5.1)
Sodium: 132 mmol/L — ABNORMAL LOW (ref 135–145)

## 2022-07-06 LAB — CBC
HCT: 35.4 % — ABNORMAL LOW (ref 36.0–46.0)
Hemoglobin: 11.8 g/dL — ABNORMAL LOW (ref 12.0–15.0)
MCH: 33.4 pg (ref 26.0–34.0)
MCHC: 33.3 g/dL (ref 30.0–36.0)
MCV: 100.3 fL — ABNORMAL HIGH (ref 80.0–100.0)
Platelets: 223 10*3/uL (ref 150–400)
RBC: 3.53 MIL/uL — ABNORMAL LOW (ref 3.87–5.11)
RDW: 12.8 % (ref 11.5–15.5)
WBC: 8.5 10*3/uL (ref 4.0–10.5)
nRBC: 0 % (ref 0.0–0.2)

## 2022-07-06 LAB — PROCALCITONIN: Procalcitonin: 3.85 ng/mL

## 2022-07-06 MED ORDER — PSYLLIUM 95 % PO PACK
1.0000 | PACK | Freq: Two times a day (BID) | ORAL | Status: DC
  Filled 2022-07-06 (×3): qty 1

## 2022-07-06 NOTE — Progress Notes (Signed)
[facility] PROGRESS NOTE    Progress Note  Dorothy Branch  [id number] DOB: 08-18-1960 DOA: 07/04/2022 PCP: Rich Fuchs, PA     Brief Narrative:   Dorothy Branch is an 61 y.o. female past medical history significant for essential hypertension former smoker who presents with 4 days of cough hypoxic and positive for influenza A on the day of admission  Assessment/Plan:   Acute respiratory failure with hypoxia ([place]) due to Influenza A with pneumonia Currently current 2 L of oxygen keep saturations greater 92%. Continue Tamiflu IV Rocephin and azithromycin. Pro-Calcitonin significantly elevated, urine strep pneumo was positive agree with IV antibiotics. PT valuated the patient recommended home health PT.  Hypokalemia Repleted now improved. Mag is 2.2.  Hypovolemic hyponatremia Continue IV fluid hydration sodium is improving slowly.  Essential hypertension Continue atenolol.  Depression with anxiety Continue Prozac.  DVT prophylaxis: lovenox Family Communication:none Status is: Inpatient Remains inpatient appropriate because: Acute respiratory failure with hypoxia due to influenza pneumonia    Code Status:     Code Status Orders  (From admission, onward)           Start     Ordered   07/04/22 2005  Full code  Continuous       Question:  By:  Answer:  Consent: discussion documented in EHR   07/04/22 2005           Code Status History     This patient has a current code status but no historical code status.         IV Access:   Peripheral IV   Procedures and diagnostic studies:   DG Chest 2 View  Result Date: 07/04/2022 CLINICAL DATA:  Hypoxia.  Influenza.  Body aches. EXAM: CHEST - 2 VIEW COMPARISON:  Chest two views 04/23/2021, 03/30/2021 FINDINGS: Cardiac silhouette and mediastinal contours are within normal limits. There are again increased lucencies within upper lungs and moderate hyperinflation, chronic emphysematous changes.  New interstitial thickening and heterogeneous airspace opacification within the right mid to upper lung, likely the anterior right upper lobe. More mild heterogeneous airspace opacification within the right lower lung. No pleural effusion pneumothorax. Mild-to-moderate multilevel degenerative disc changes of the thoracic spine. IMPRESSION: 1. Interstitial thickening and heterogeneous airspace opacification within the right mid to upper lung, likely pneumonia within the anterior right upper lobe. Recommend radiographic follow-up in 4-6 weeks to ensure complete resolution. 2. Chronic emphysematous changes. Electronically Signed   By: Yvonne Kendall M.D.   On: 07/04/2022 15:06     Medical Consultants:   None.   Subjective:    Dorothy Branch relates her breathing is improved  Objective:    Vitals:   07/05/22 1213 07/05/22 1700 07/05/22 2142 07/06/22 0448  BP: (!) 152/84 (!) 149/81 (!) 164/98 (!) 166/101  Pulse: 83 86 93 93  Resp: 19 18 16 14   Temp: 97.9 F (36.6 C) 98 F (36.7 C) 98.6 F (37 C) 98 F (36.7 C)  TempSrc: Oral Oral Oral Oral  SpO2: 95% 98% 95% 92%  Weight:      Height:       SpO2: 92 % O2 Flow Rate (L/min): 2 L/min   Intake/Output Summary (Last 24 hours) at 07/06/2022 1129 Last data filed at 07/06/2022 0453 Gross per 24 hour  Intake 745.92 ml  Output 200 ml  Net 545.92 ml    Filed Weights   07/04/22 2002  Weight: 56.1 kg    Exam: General exam: In no acute distress. Respiratory system:  Good air movement and diffuse crackles bilaterally Cardiovascular system: S1 & S2 heard, RRR. No JVD. Gastrointestinal system: Abdomen is nondistended, soft and nontender.  Extremities: No pedal edema. Skin: No rashes, lesions or ulcers Psychiatry: Judgement and insight appear normal. Mood & affect appropriate.   Data Reviewed:    Labs: Basic Metabolic Panel: Recent Labs  Lab 07/04/22 1428 07/05/22 0506 07/06/22 0602  NA 130* 131* 132*  K 2.8* 2.5* 4.0  CL 93*  98 102  CO2 21* 24 20*  GLUCOSE 106* 105* 82  BUN 15 9 8   CREATININE 0.82 0.58 0.64  CALCIUM 8.5* 8.5* 8.9  MG  --  2.2  --     GFR Estimated Creatinine Clearance: 56.9 mL/min (by C-G formula based on SCr of 0.64 mg/dL). Liver Function Tests: No results for input(s): "AST", "ALT", "ALKPHOS", "BILITOT", "PROT", "ALBUMIN" in the last 168 hours. No results for input(s): "LIPASE", "AMYLASE" in the last 168 hours. No results for input(s): "AMMONIA" in the last 168 hours. Coagulation profile No results for input(s): "INR", "PROTIME" in the last 168 hours. COVID-19 Labs  No results for input(s): "DDIMER", "FERRITIN", "LDH", "CRP" in the last 72 hours.  No results found for: "SARSCOV2NAA"  CBC: Recent Labs  Lab 07/04/22 1428 07/05/22 0506 07/06/22 0602  WBC 9.6 9.8 8.5  HGB 13.0 11.3* 11.8*  HCT 36.2 32.7* 35.4*  MCV 95.3 97.6 100.3*  PLT 218 190 223    Cardiac Enzymes: No results for input(s): "CKTOTAL", "CKMB", "CKMBINDEX", "TROPONINI" in the last 168 hours. BNP (last 3 results) No results for input(s): "PROBNP" in the last 8760 hours. CBG: No results for input(s): "GLUCAP" in the last 168 hours. D-Dimer: No results for input(s): "DDIMER" in the last 72 hours. Hgb A1c: No results for input(s): "HGBA1C" in the last 72 hours. Lipid Profile: No results for input(s): "CHOL", "HDL", "LDLCALC", "TRIG", "CHOLHDL", "LDLDIRECT" in the last 72 hours. Thyroid function studies: No results for input(s): "TSH", "T4TOTAL", "T3FREE", "THYROIDAB" in the last 72 hours.  Invalid input(s): "FREET3" Anemia work up: No results for input(s): "VITAMINB12", "FOLATE", "FERRITIN", "TIBC", "IRON", "RETICCTPCT" in the last 72 hours. Sepsis Labs: Recent Labs  Lab 07/04/22 1428 07/04/22 2120 07/05/22 0506 07/06/22 0602  PROCALCITON  --  13.03 8.39  --   WBC 9.6  --  9.8 8.5    Microbiology No results found for this or any previous visit (from the past 240 hour(s)).   Medications:     atenolol  25 mg Oral Daily   azithromycin  500 mg Oral Daily   enoxaparin (LOVENOX) injection  40 mg Subcutaneous QHS   FLUoxetine  40 mg Oral Daily   oseltamivir  75 mg Oral BID   pantoprazole  40 mg Oral Daily   Continuous Infusions:  cefTRIAXone (ROCEPHIN)  IV Stopped (07/05/22 2012)      LOS: 2 days   07/07/22  [facility]  07/06/2022, 11:29 AM

## 2022-07-06 NOTE — Evaluation (Signed)
Physical Therapy Evaluation-1x Patient Details Name: Dorothy Branch MRN: [id number] DOB: 11-16-1960 Today's Date: 07/06/2022  History of Present Illness  61 yo female admitted with flu A, Pna, hypokalemia, acute resp failure.  Clinical Impression  On eval, pt was Mod Ind with mobility. She walked ~185 feet around the unit. HR up to 125 bpm, O2 90% on RA with ambulation. At rest, O2 93% on RA. Pt tolerated activity well-mild dyspnea. She reports she has been mobilizing in her room unassisted-frequent trips to [facility] 2* diarrhea. Will defer any further mobility needs and pulmonary O2 sat monitoring  to nursing and/or mobility team. 1x eval. Will sign off.      Recommendations for follow up therapy are one component of a multi-disciplinary discharge planning process, led by the attending physician.  Recommendations may be updated based on patient status, additional functional criteria and insurance authorization.  Follow Up Recommendations No PT follow up      Assistance Recommended at Discharge PRN  Patient can return home with the following       Equipment Recommendations None recommended by PT  Recommendations for Other Services       Functional Status Assessment Patient has had a recent decline in their functional status and demonstrates the ability to make significant improvements in function in a reasonable and predictable amount of time.     Precautions / Restrictions Precautions Precaution Comments: monitor O2, HR Restrictions Weight Bearing Restrictions: No      Mobility  Bed Mobility Overal bed mobility: Modified Independent                  Transfers Overall transfer level: Modified independent                      Ambulation/Gait Ambulation/Gait assistance: Modified independent (Device/Increase time) Gait Distance (Feet): 185 Feet Assistive device: None, IV Pole Gait Pattern/deviations: Step-through pattern, Decreased stride length        General Gait Details: Supv only. HR 125 bpm, O2 90% on RA, dyspnea 2/4. No overt LOB. Mildly unsteady intermittently.  Stairs            Wheelchair Mobility    Modified Rankin (Stroke Patients Only)       Balance Overall balance assessment: Mild deficits observed, not formally tested                                           Pertinent Vitals/Pain Pain Assessment Pain Assessment: No/denies pain    Home Living Family/patient expects to be discharged to:: Private residence Living Arrangements: Spouse/significant other Available Help at Discharge: Family Type of Home: House         Home Layout: Two level Home Equipment: Agricultural consultant (2 wheels);Cane - single point      Prior Function Prior Level of Function : Independent/Modified Independent                     Hand Dominance        Extremity/Trunk Assessment   Upper Extremity Assessment Upper Extremity Assessment: Overall WFL for tasks assessed    Lower Extremity Assessment Lower Extremity Assessment: Overall WFL for tasks assessed    Cervical / Trunk Assessment Cervical / Trunk Assessment: Normal  Communication   Communication: No difficulties  Cognition Arousal/Alertness: Awake/alert Behavior During Therapy: WFL for tasks assessed/performed Overall Cognitive Status: Within Functional  Limits for tasks assessed                                          General Comments      Exercises     Assessment/Plan    PT Assessment Patient does not need any further PT services  PT Problem List         PT Treatment Interventions      PT Goals (Current goals can be found in the Care Plan section)  Acute Rehab PT Goals Patient Stated Goal: to get better PT Goal Formulation: All assessment and education complete, DC therapy    Frequency       Co-evaluation               AM-PAC PT "6 Clicks" Mobility  Outcome Measure Help needed turning from  your back to your side while in a flat bed without using bedrails?: None Help needed moving from lying on your back to sitting on the side of a flat bed without using bedrails?: None Help needed moving to and from a bed to a chair (including a wheelchair)?: None Help needed standing up from a chair using your arms (e.g., wheelchair or bedside chair)?: None Help needed to walk in hospital room?: None Help needed climbing 3-5 steps with a railing? : None 6 Click Score: 24    End of Session Equipment Utilized During Treatment: Gait belt Activity Tolerance: Patient tolerated treatment well Patient left: in bed;with call bell/phone within reach;with nursing/sitter in room        Time: 1019-1036 PT Time Calculation (min) (ACUTE ONLY): 17 min   Charges:   PT Evaluation $PT Eval Low Complexity: 1 Low            Faye Ramsay, PT Acute Rehabilitation  Office: [phone]

## 2022-07-07 LAB — CBC
HCT: 32.7 % — ABNORMAL LOW (ref 36.0–46.0)
Hemoglobin: 11.3 g/dL — ABNORMAL LOW (ref 12.0–15.0)
MCH: 33.7 pg (ref 26.0–34.0)
MCHC: 34.6 g/dL (ref 30.0–36.0)
MCV: 97.6 fL (ref 80.0–100.0)
Platelets: 271 10*3/uL (ref 150–400)
RBC: 3.35 MIL/uL — ABNORMAL LOW (ref 3.87–5.11)
RDW: 12.5 % (ref 11.5–15.5)
WBC: 7.8 10*3/uL (ref 4.0–10.5)
nRBC: 0 % (ref 0.0–0.2)

## 2022-07-07 LAB — BASIC METABOLIC PANEL
Anion gap: 13 (ref 5–15)
BUN: 6 mg/dL — ABNORMAL LOW (ref 8–23)
CO2: 21 mmol/L — ABNORMAL LOW (ref 22–32)
Calcium: 8.6 mg/dL — ABNORMAL LOW (ref 8.9–10.3)
Chloride: 97 mmol/L — ABNORMAL LOW (ref 98–111)
Creatinine, Ser: 0.57 mg/dL (ref 0.44–1.00)
GFR, Estimated: >60 mL/min (ref >60)
Glucose, Bld: 86 mg/dL (ref 70–99)
Potassium: 2.9 mmol/L — ABNORMAL LOW (ref 3.5–5.1)
Sodium: 131 mmol/L — ABNORMAL LOW (ref 135–145)

## 2022-07-07 MED ORDER — AMOXICILLIN-POT CLAVULANATE 875-125 MG PO TABS
1.0000 | ORAL_TABLET | Freq: Two times a day (BID) | ORAL | Status: DC
  Administered 2022-07-07: 1 via ORAL
  Filled 2022-07-07 (×2): qty 1

## 2022-07-07 MED ORDER — AZITHROMYCIN 250 MG PO TABS: ORAL_TABLET | ORAL | 0 refills | Status: AC

## 2022-07-07 MED ORDER — OSELTAMIVIR PHOSPHATE 75 MG PO CAPS: 75.0000 mg | ORAL_CAPSULE | Freq: Two times a day (BID) | ORAL | 0 refills | Status: AC

## 2022-07-07 MED ORDER — AMOXICILLIN-POT CLAVULANATE 875-125 MG PO TABS: 1.0000 | ORAL_TABLET | Freq: Two times a day (BID) | ORAL | 0 refills | Status: AC

## 2022-07-07 NOTE — Plan of Care (Signed)

## 2022-07-07 NOTE — Progress Notes (Addendum)
  Transition of Care [facility]) Screening Note   Patient Details  Name: Mishelle Hassan Date of Birth: October 19, 1960   Transition of Care [facility]) CM/SW Contact:    Lanier Clam, RN Phone Number: 07/07/2022, 10:28 AM  -10:34a noted 02 ordered. Await 02 sats documented prior delivery of home 02 travel tank to rm.patient states she has health insurance but doesn't know the name.  Transition of Care [facility]) has reviewed patient and no TOC needs have been identified at this time. We will continue to monitor patient advancement through interdisciplinary progression rounds. If new patient transition needs arise, please place a TOC consult.

## 2022-07-07 NOTE — Discharge Summary (Signed)
Physician Discharge Summary  Dorothy Branch [id number]:[id number] DOB: June 08, 1961 DOA: 07/04/2022  PCP: Heron Nay, PA  Admit date: 07/04/2022 Discharge date: 07/07/2022  Admitted From: Home Disposition:  Home  Recommendations for Outpatient Follow-up:  Follow up with PCP in 1-2 weeks Please obtain BMP/CBC in one week   Home Health:No Equipment/Devices:None  Discharge Condition:Stable CODE STATUS:Full Diet recommendation: Heart Healthy   Brief/Interim Summary: 61 y.o. female past medical history significant for essential hypertension former smoker who presents with 4 days of cough hypoxic and positive for influenza A on the day of admission   Discharge Diagnoses:  Principal Problem:   Influenza A with pneumonia Active Problems:   Acute respiratory failure with hypoxia (HCC)   Hypokalemia   Hyponatremia   Essential hypertension   Depression with anxiety  Acute respiratory failure with hypoxia due to influenza A pneumonia: She was placed on oxygen and started on Tamiflu IV Rocephin and azithromycin procalcitonin was significantly elevated, urine strep pneumo was positive. She defervesced oxygen requirements decreased. She will go home with Tamiflu for 2 additional days and oral antibiotics for 4 additional days.  Hypokalemia: Repleted orally now resolved.  Hypovolemic hyponatremia: Resolved with IV fluid hydration.  Essential hypertension: No change made to her medication.  Depression/anxiety: No change made to her medication  Discharge Instructions  Discharge Instructions     Diet - low sodium heart healthy   Complete by: As directed    Increase activity slowly   Complete by: As directed       Allergies as of 07/07/2022       Reactions   Prednisone Hives, Rash   Amoxicillin-pot Clavulanate Diarrhea   Sulfamethoxazole Rash        Medication List     TAKE these medications    amoxicillin-clavulanate 875-125 MG tablet Commonly known as:  AUGMENTIN Take 1 tablet by mouth every 12 (twelve) hours for 4 days.   atenolol 25 MG tablet Commonly known as: TENORMIN Take 25 mg by mouth daily.   azithromycin 250 MG tablet Commonly known as: ZITHROMAX Take 1 tab daily   FLUoxetine 40 MG capsule Commonly known as: PROZAC Take 40 mg by mouth daily.   FLUoxetine 20 MG capsule Commonly known as: PROZAC TAKE 1 BY MOUTH DAILY   ibuprofen 200 MG tablet Commonly known as: ADVIL Take 200-600 mg by mouth every 6 (six) hours as needed for mild pain or headache.   omeprazole 20 MG capsule Commonly known as: PRILOSEC Take 20 mg by mouth daily before breakfast.   oseltamivir 75 MG capsule Commonly known as: TAMIFLU Take 1 capsule (75 mg total) by mouth 2 (two) times daily for 2 days.   tiZANidine 2 MG tablet Commonly known as: ZANAFLEX TAKE 1-2 TABS UP TO TWICE DAILY AS NEEDED.   ZyrTEC-D Allergy & Congestion 5-120 MG tablet Generic drug: cetirizine-pseudoephedrine Take 1 tablet by mouth every 12 (twelve) hours as needed for allergies or rhinitis.               Durable Medical Equipment  (From admission, onward)           Start     Ordered   07/07/22 1002  For home use only DME oxygen  Once       Question Answer Comment  Length of Need 6 Months   Mode or (Route) Nasal cannula   Liters per Minute 2   Frequency Continuous (stationary and portable oxygen unit needed)   Oxygen conserving device Yes   Oxygen  delivery system Gas      07/07/22 1001            Allergies  Allergen Reactions   Prednisone Hives and Rash   Amoxicillin-Pot Clavulanate Diarrhea   Sulfamethoxazole Rash    Consultations: None   Procedures/Studies: DG Chest 2 View  Result Date: 07/04/2022 CLINICAL DATA:  Hypoxia.  Influenza.  Body aches. EXAM: CHEST - 2 VIEW COMPARISON:  Chest two views 04/23/2021, 03/30/2021 FINDINGS: Cardiac silhouette and mediastinal contours are within normal limits. There are again increased lucencies  within upper lungs and moderate hyperinflation, chronic emphysematous changes. New interstitial thickening and heterogeneous airspace opacification within the right mid to upper lung, likely the anterior right upper lobe. More mild heterogeneous airspace opacification within the right lower lung. No pleural effusion pneumothorax. Mild-to-moderate multilevel degenerative disc changes of the thoracic spine. IMPRESSION: 1. Interstitial thickening and heterogeneous airspace opacification within the right mid to upper lung, likely pneumonia within the anterior right upper lobe. Recommend radiographic follow-up in 4-6 weeks to ensure complete resolution. 2. Chronic emphysematous changes. Electronically Signed   By: Neita Garnet M.D.   On: 07/04/2022 15:06   (Echo, Carotid, EGD, Colonoscopy, ERCP)    Subjective: No complaints  Discharge Exam: Vitals:   07/06/22 2104 07/07/22 0650  BP: 138/65 (!) 146/63  Pulse: 82 81  Resp: 20 18  Temp: 97.9 F (36.6 C) 98.2 F (36.8 C)  SpO2: 96% 94%   Vitals:   07/06/22 0448 07/06/22 1231 07/06/22 2104 07/07/22 0650  BP: (!) 166/101 (!) 155/78 138/65 (!) 146/63  Pulse: 93 80 82 81  Resp: 14 20 20 18   Temp: 98 F (36.7 C) F (36.7 C) 97.9 F (36.6 C) 98.2 F (36.8 C)  TempSrc: Oral Oral Oral Oral  SpO2: 92% 98% 96% 94%  Weight:      Height:        General: Pt is alert, awake, not in acute distress Cardiovascular: RRR, S1/S2 +, no rubs, no gallops Respiratory: CTA bilaterally, no wheezing, no rhonchi Abdominal: Soft, NT, ND, bowel sounds + Extremities: no edema, no cyanosis    The results of significant diagnostics from this hospitalization (including imaging, microbiology, ancillary and laboratory) are listed below for reference.     Microbiology: No results found for this or any previous visit (from the past 240 hour(s)).   Labs: BNP (last 3 results) No results for input(s): "BNP" in the last 8760 hours. Basic Metabolic  Panel: Recent Labs  Lab 07/04/22 1428 07/05/22 0506 07/06/22 0602 07/07/22 0527  NA 130* 131* 132* 131*  K 2.8* 2.5* 4.0 2.9*  CL 93* 98 102 97*  CO2 21* 24 20* 21*  GLUCOSE 106* 105* 82 86  BUN 15 9 8  6*  CREATININE 0.82 0.58 0.64 0.57  CALCIUM 8.5* 8.5* 8.9 8.6*  MG  --  2.2  --   --    Liver Function Tests: No results for input(s): "AST", "ALT", "ALKPHOS", "BILITOT", "PROT", "ALBUMIN" in the last 168 hours. No results for input(s): "LIPASE", "AMYLASE" in the last 168 hours. No results for input(s): "AMMONIA" in the last 168 hours. CBC: Recent Labs  Lab 07/04/22 1428 07/05/22 0506 07/06/22 0602 07/07/22 0527  WBC 9.6 9.8 8.5 7.8  HGB 13.0 11.3* 11.8* 11.3*  HCT 36.2 32.7* 35.4* 32.7*  MCV 95.3 97.6 100.3* 97.6  PLT 218 190 223 271   Cardiac Enzymes: No results for input(s): "CKTOTAL", "CKMB", "CKMBINDEX", "TROPONINI" in the last 168 hours. BNP: Invalid input(s): "POCBNP"  CBG: No results for input(s): "GLUCAP" in the last 168 hours. D-Dimer No results for input(s): "DDIMER" in the last 72 hours. Hgb A1c No results for input(s): "HGBA1C" in the last 72 hours. Lipid Profile No results for input(s): "CHOL", "HDL", "LDLCALC", "TRIG", "CHOLHDL", "LDLDIRECT" in the last 72 hours. Thyroid function studies No results for input(s): "TSH", "T4TOTAL", "T3FREE", "THYROIDAB" in the last 72 hours.  Invalid input(s): "FREET3" Anemia work up No results for input(s): "VITAMINB12", "FOLATE", "FERRITIN", "TIBC", "IRON", "RETICCTPCT" in the last 72 hours. Urinalysis No results found for: "COLORURINE", "APPEARANCEUR", "LABSPEC", "PHURINE", "GLUCOSEU", "HGBUR", "BILIRUBINUR", "KETONESUR", "PROTEINUR", "UROBILINOGEN", "NITRITE", "LEUKOCYTESUR" Sepsis Labs Recent Labs  Lab 07/04/22 1428 07/05/22 0506 07/06/22 0602 07/07/22 0527  WBC 9.6 9.8 8.5 7.8   Microbiology No results found for this or any previous visit (from the past 240 hour(s)).   SIGNED:   Marinda Elk, MD  [facility] 07/07/2022, 10:01 AM Pager   If 7PM-7AM, please contact night-coverage [email or web address] Password [email or web address]

## 2022-07-07 NOTE — TOC Transition Note (Addendum)
Transition of Care [facility]) - CM/SW Discharge Note   Patient Details  Name: Dorothy Branch MRN: [id number] Date of Birth: November 05, 1960  Transition of Care [facility]) CM/SW Contact:  Lanier Clam, RN Phone Number: 07/07/2022, 11:46 AM   Clinical Narrative: Noted qualifies for home 02-[employer] to deliver travel tank to rm prior d/c. No further CM needs. -1:23p-patient pleasantly declines home 02 d/t she doesn't have insurance card with her/doesn't know her insurance,& she didn't provide [employer] any credit card to place on file for payment. nsg aware.      Final next level of care: Home/Self Care Barriers to Discharge: No Barriers Identified   Patient Goals and CMS Choice      Discharge Placement                         Discharge Plan and Services Additional resources added to the After Visit Summary for     Discharge Planning Services: CM Consult            DME Arranged: Oxygen DME Agency: [employer] Date DME Agency Contacted: 07/07/22 Time DME Agency Contacted: 1145 Representative spoke with at DME Agency: Jasmin            Social Determinants of Health (SDOH) Interventions SDOH Screenings   Food Insecurity: No Food Insecurity (07/04/2022)  Housing: Low Risk  (07/04/2022)  Transportation Needs: No Transportation Needs (07/04/2022)  Utilities: Not At Risk (07/04/2022)  Tobacco Use: High Risk (07/04/2022)     Readmission Risk Interventions     No data to display

## 2022-07-07 NOTE — Care Management (Signed)
SATURATION QUALIFICATIONS: (This note is used to comply with regulatory documentation for home oxygen)  Patient Saturations on Room Air at Rest = 88%  Patient Saturations on Room Air while Ambulating = 81%  Patient Saturations on 2 Liters of oxygen while Ambulating = 93%  Please briefly explain why patient needs home oxygen: Patient continues to be short of breath even during exertion. Needs oxygen to bring oxygen level up again.

## 2022-07-10 LAB — LEGIONELLA PNEUMOPHILA SEROGP 1 UR AG: L. pneumophila Serogp 1 Ur Ag: NEGATIVE
